# Patient Record
Sex: Male | Born: 2000 | Race: Black or African American | Hispanic: No | Marital: Single | Smoking: Current some day smoker
Health system: Southern US, Community
[De-identification: ages and names within clinical notes are randomized; demographics above are authoritative.]

## PROBLEM LIST (undated history)

## (undated) DIAGNOSIS — F909 Attention-deficit hyperactivity disorder, unspecified type: Secondary | ICD-10-CM

## (undated) HISTORY — PX: NO PAST SURGERIES: SHX2092

---
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
## 2019-03-22 ENCOUNTER — Ambulatory Visit (HOSPITAL_COMMUNITY)
Admission: EM | Admit: 2019-03-22 | Discharge: 2019-03-22 | Disposition: A | Discharge status: Home / Self Care | Payer: 59 | Attending: Internal Medicine | Admitting: Internal Medicine

## 2019-03-22 ENCOUNTER — Other Ambulatory Visit: Payer: Self-pay

## 2019-03-22 ENCOUNTER — Encounter (HOSPITAL_COMMUNITY): Payer: Self-pay

## 2019-03-22 DIAGNOSIS — Z20822 Contact with and (suspected) exposure to covid-19: Secondary | ICD-10-CM

## 2019-03-22 DIAGNOSIS — Z20828 Contact with and (suspected) exposure to other viral communicable diseases: Secondary | ICD-10-CM | POA: Insufficient documentation

## 2019-03-22 NOTE — ED Provider Notes (Signed)
[facility]    CSN: [id number] Arrival date & time: 03/22/19  1947      History   Chief Complaint Chief Complaint  Patient presents with  . covid test    HPI Garrett Leach is a 18 y.o. male with no past medical history comes to urgent care for COVID-19 testing.  His stepfather tested positive for COVID-19 today.  Patient has no symptoms.   HPI  History reviewed. No pertinent past medical history.  There are no problems to display for this patient.   History reviewed. No pertinent surgical history.     Home Medications    Prior to Admission medications   Not on File    Family History Family History  Problem Relation Age of Onset  . Healthy Mother   . Healthy Father     Social History Social History   Tobacco Use  . Smoking status: Never Smoker  . Smokeless tobacco: Never Used  Substance Use Topics  . Alcohol use: No  . Drug use: Not on file     Allergies   Patient has no known allergies.   Review of Systems Review of Systems  Constitutional: Negative for activity change, chills, fatigue and fever.  Respiratory: Negative for cough and shortness of breath.   Gastrointestinal: Negative for diarrhea, nausea and vomiting.  Neurological: Negative for dizziness, light-headedness and headaches.     Physical Exam Triage Vital Signs ED Triage Vitals  Enc Vitals Group     BP 03/22/19 2002 (!) 151/90     Pulse --      Resp 03/22/19 2002 18     Temp 03/22/19 2002 99.2 F (37.3 C)     Temp Source 03/22/19 2002 Oral     SpO2 03/22/19 2002 100 %     Weight 03/22/19 2001 (!) 304 lb (137.9 kg)     Height --      Head Circumference --      Peak Flow --      Pain Score 03/22/19 2001 0     Pain Loc --      Pain Edu? --      Excl. in GC? --    No data found.  Updated Vital Signs BP (!) 151/90 (BP Location: Right Arm)   Temp 99.2 F (37.3 C) (Oral)   Resp 18   Wt (!) 137.9 kg   SpO2 100%   Visual Acuity Right Eye Distance:     Left Eye Distance:   Bilateral Distance:    Right Eye Near:   Left Eye Near:    Bilateral Near:     Physical Exam Vitals and nursing note reviewed.  Constitutional:      General: He is not in acute distress.    Appearance: Normal appearance. He is not ill-appearing.  Cardiovascular:     Rate and Rhythm: Normal rate and regular rhythm.     Pulses: Normal pulses.     Heart sounds: Normal heart sounds.  Pulmonary:     Effort: Pulmonary effort is normal.     Breath sounds: Normal breath sounds.  Neurological:     Mental Status: He is alert.      UC Treatments / Results  Labs (all labs ordered are listed, but only abnormal results are displayed) Labs Reviewed  NOVEL CORONAVIRUS, NAA (HOSP ORDER, SEND-OUT TO REF LAB; TAT 18-24 HRS)    EKG   Radiology No results found.  Procedures Procedures (including critical care time)  Medications Ordered  in UC Medications - No data to display  Initial Impression / Assessment and Plan / UC Course  I have reviewed the triage vital signs and the nursing notes.  Pertinent labs & imaging results that were available during my care of the patient were reviewed by me and considered in my medical decision making (see chart for details).     1.  Close exposure to COVID-19 positive patient: COVID-19 PCR testing has been sent Patient is advised to self isolate until COVID-19 test results are available If patient develops any symptoms she can reach out to the urgent care via virtual visit to have his symptoms addressed or he can come in for in person evaluation. Final Clinical Impressions(s) / UC Diagnoses   Final diagnoses:  Close exposure to COVID-19 virus   Discharge Instructions   None    ED Prescriptions    None     PDMP not reviewed this encounter.   Chase Picket, MD 03/22/19 2027

## 2019-03-22 NOTE — ED Triage Notes (Addendum)
Pt state he needs to be tested for Covid . Because he has been exposed to it. Pt states his Step Dad has it.

## 2019-03-25 LAB — NOVEL CORONAVIRUS, NAA (HOSP ORDER, SEND-OUT TO REF LAB; TAT 18-24 HRS): SARS-CoV-2, NAA: NOT DETECTED

## 2019-09-25 ENCOUNTER — Encounter (HOSPITAL_COMMUNITY): Payer: Self-pay

## 2019-09-25 ENCOUNTER — Other Ambulatory Visit: Payer: Self-pay

## 2019-09-25 ENCOUNTER — Ambulatory Visit (HOSPITAL_COMMUNITY)
Admission: EM | Admit: 2019-09-25 | Discharge: 2019-09-25 | Disposition: A | Discharge status: Home / Self Care | Payer: BC Managed Care – PPO | Attending: Physician Assistant | Admitting: Physician Assistant

## 2019-09-25 DIAGNOSIS — L853 Xerosis cutis: Secondary | ICD-10-CM

## 2019-09-25 MED ORDER — TRIAMCINOLONE ACETONIDE 0.025 % EX OINT: 1.0000 "application " | TOPICAL_OINTMENT | Freq: Two times a day (BID) | CUTANEOUS | 0 refills | Status: AC

## 2019-09-25 NOTE — Discharge Instructions (Addendum)
Apply this cream to your heel twice a day, then apply a zinc oxide cream (OTC) on top of this twice a day as well. Sleep with this area covered while the cream is on at night. Follow up with Dermatology if this is not  improving. Keep area clean between times of application.

## 2019-09-25 NOTE — ED Provider Notes (Signed)
[facility]    CSN: [id number] Arrival date & time: 09/25/19  1518      History   Chief Complaint Chief Complaint  Patient presents with  . Callous Foot    HPI Garrett Leach is a 19 y.o. male.   Presents with cracked skin to the heel area of his left foot. Onset for a few months, but cracked over the last few days. No fever or drainage. Only pain with shoes. Not using any topical treatment. No prior skin history is noted.      History reviewed. No pertinent past medical history.  There are no problems to display for this patient.   History reviewed. No pertinent surgical history.     Home Medications    Prior to Admission medications   Medication Sig Start Date End Date Taking? Authorizing Provider  triamcinolone (KENALOG) 0.025 % ointment Apply 1 application topically 2 (two) times daily for 14 days. 09/25/19 10/09/19  Riki Sheer, PA-C    Family History Family History  Problem Relation Age of Onset  . Healthy Mother   . Healthy Father     Social History Social History   Tobacco Use  . Smoking status: Never Smoker  . Smokeless tobacco: Never Used  Substance Use Topics  . Alcohol use: No  . Drug use: Not on file     Allergies   Patient has no known allergies.   Review of Systems Review of Systems  Constitutional: Negative for fatigue and fever.     Physical Exam Triage Vital Signs ED Triage Vitals  Enc Vitals Group     BP 09/25/19 1550 124/60     Pulse Rate 09/25/19 1550 61     Resp 09/25/19 1550 18     Temp 09/25/19 1550 97.8 F (36.6 C)     Temp Source 09/25/19 1550 Oral     SpO2 09/25/19 1550 93 %     Weight --      Height --      Head Circumference --      Peak Flow --      Pain Score 09/25/19 1548 0     Pain Loc --      Pain Edu? --      Excl. in GC? --    No data found.  Updated Vital Signs BP 124/60 (BP Location: Right Arm)   Pulse 61   Temp 97.8 F (36.6 C) (Oral)   Resp 18   SpO2 93%   Visual  Acuity Right Eye Distance:   Left Eye Distance:   Bilateral Distance:    Right Eye Near:   Left Eye Near:    Bilateral Near:     Physical Exam Vitals and nursing note reviewed.  Constitutional:      Appearance: Normal appearance.  Skin:    General: Skin is warm and dry.     Comments: Skin thickening and fissure along left foot, approx 2 cm in length. No drainage.   Neurological:     General: No focal deficit present.     Mental Status: He is alert.  Psychiatric:        Mood and Affect: Mood normal.      UC Treatments / Results  Labs (all labs ordered are listed, but only abnormal results are displayed) Labs Reviewed - No data to display  EKG   Radiology No results found.  Procedures Procedures (including critical care time)  Medications Ordered in UC Medications - No data  to display  Initial Impression / Assessment and Plan / UC Course  I have reviewed the triage vital signs and the nursing notes.  Pertinent labs & imaging results that were available during my care of the patient were reviewed by me and considered in my medical decision making (see chart for details).     Treat with LP steroid cream with a layer of barrier cream twice a day. Keep covered and clean. If not improving f/u with Dermatology for further evaluation.  Final Clinical Impressions(s) / UC Diagnoses   Final diagnoses:  Xerosis of skin     Discharge Instructions     Apply this cream to your heel twice a day, then apply a zinc oxide cream (OTC) on top of this twice a day as well. Sleep with this area covered while the cream is on at night. Follow up with Dermatology if this is not  improving. Keep area clean between times of application.    ED Prescriptions    Medication Sig Dispense Auth. Provider   triamcinolone (KENALOG) 0.025 % ointment Apply 1 application topically 2 (two) times daily for 14 days. 30 g Bjorn Pippin, [place]     PDMP not reviewed this encounter.   Bjorn Pippin, [place] 09/25/19 1639

## 2019-09-25 NOTE — ED Triage Notes (Signed)
Pt presents with heavily callous area on back of left foot for a few months; pt states he has an area on right foot as well, it just is not as dry.

## 2019-11-25 ENCOUNTER — Ambulatory Visit (HOSPITAL_COMMUNITY)
Admission: EM | Admit: 2019-11-25 | Discharge: 2019-11-25 | Disposition: A | Discharge status: Home / Self Care | Payer: BC Managed Care – PPO | Attending: Internal Medicine | Admitting: Internal Medicine

## 2019-11-25 ENCOUNTER — Other Ambulatory Visit: Payer: Self-pay

## 2019-11-25 ENCOUNTER — Encounter (HOSPITAL_COMMUNITY): Payer: Self-pay

## 2019-11-25 DIAGNOSIS — U071 COVID-19: Secondary | ICD-10-CM | POA: Diagnosis not present

## 2019-11-25 DIAGNOSIS — Z20822 Contact with and (suspected) exposure to covid-19: Secondary | ICD-10-CM | POA: Diagnosis present
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
## 2019-11-26 LAB — SARS CORONAVIRUS 2 (TAT 6-24 HRS): SARS Coronavirus 2: POSITIVE — AB

## 2020-01-23 ENCOUNTER — Other Ambulatory Visit: Payer: Self-pay

## 2020-01-23 ENCOUNTER — Emergency Department (HOSPITAL_COMMUNITY): Payer: BC Managed Care – PPO

## 2020-01-23 ENCOUNTER — Encounter (HOSPITAL_COMMUNITY): Payer: Self-pay | Admitting: Emergency Medicine

## 2020-01-23 ENCOUNTER — Emergency Department (HOSPITAL_COMMUNITY)
Admission: EM | Admit: 2020-01-23 | Discharge: 2020-01-23 | Disposition: A | Discharge status: Home / Self Care | Payer: BC Managed Care – PPO | Attending: Emergency Medicine | Admitting: Emergency Medicine

## 2020-01-23 DIAGNOSIS — T07XXXA Unspecified multiple injuries, initial encounter: Secondary | ICD-10-CM

## 2020-01-23 DIAGNOSIS — S80212A Abrasion, left knee, initial encounter: Secondary | ICD-10-CM | POA: Diagnosis not present

## 2020-01-23 DIAGNOSIS — Y92410 Unspecified street and highway as the place of occurrence of the external cause: Secondary | ICD-10-CM | POA: Diagnosis not present

## 2020-01-23 DIAGNOSIS — Z23 Encounter for immunization: Secondary | ICD-10-CM | POA: Insufficient documentation

## 2020-01-23 DIAGNOSIS — S60922A Unspecified superficial injury of left hand, initial encounter: Secondary | ICD-10-CM | POA: Diagnosis present

## 2020-01-23 DIAGNOSIS — S66911A Strain of unspecified muscle, fascia and tendon at wrist and hand level, right hand, initial encounter: Secondary | ICD-10-CM

## 2020-01-23 DIAGNOSIS — S66912A Strain of unspecified muscle, fascia and tendon at wrist and hand level, left hand, initial encounter: Secondary | ICD-10-CM | POA: Diagnosis not present

## 2020-01-23 DIAGNOSIS — S80211A Abrasion, right knee, initial encounter: Secondary | ICD-10-CM | POA: Insufficient documentation

## 2020-01-23 MED ORDER — IBUPROFEN 600 MG PO TABS: 600.0000 mg | ORAL_TABLET | Freq: Four times a day (QID) | ORAL | 0 refills | Status: DC | PRN

## 2020-01-23 MED ORDER — TETANUS-DIPHTH-ACELL PERTUSSIS 5-2.5-18.5 LF-MCG/0.5 IM SUSP
0.5000 mL | Freq: Once | INTRAMUSCULAR | Status: AC
  Administered 2020-01-23: 0.5 mL via INTRAMUSCULAR
  Filled 2020-01-23: qty 0.5

## 2020-01-23 MED ORDER — IBUPROFEN 800 MG PO TABS
800.0000 mg | ORAL_TABLET | Freq: Once | ORAL | Status: AC
  Administered 2020-01-23: 800 mg via ORAL
  Filled 2020-01-23: qty 1

## 2020-01-23 MED ORDER — CYCLOBENZAPRINE HCL 10 MG PO TABS: 10.0000 mg | ORAL_TABLET | Freq: Two times a day (BID) | ORAL | 0 refills | Status: DC | PRN
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
## 2020-01-23 NOTE — ED Notes (Addendum)
Applied ace wrap to right wrist. Pt endorses comfort. Pt calling father for ride home. Pt alert and oriented X4 and states he will walk to lobby at discharge.  Pt verbalized understanding of discharge instructions and follow up care.
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
## 2020-01-23 NOTE — ED Notes (Signed)
Patient transported to x-ray. ?

## 2020-05-16 ENCOUNTER — Other Ambulatory Visit: Payer: Self-pay

## 2020-05-16 ENCOUNTER — Ambulatory Visit (HOSPITAL_COMMUNITY)
Admission: EM | Admit: 2020-05-16 | Discharge: 2020-05-16 | Disposition: A | Discharge status: Home / Self Care | Payer: BC Managed Care – PPO | Attending: Urgent Care | Admitting: Urgent Care

## 2020-05-16 ENCOUNTER — Encounter (HOSPITAL_COMMUNITY): Payer: Self-pay | Admitting: Emergency Medicine

## 2020-05-16 DIAGNOSIS — R229 Localized swelling, mass and lump, unspecified: Secondary | ICD-10-CM

## 2020-05-16 DIAGNOSIS — M542 Cervicalgia: Secondary | ICD-10-CM

## 2020-05-16 MED ORDER — TIZANIDINE HCL 4 MG PO TABS: 4.0000 mg | ORAL_TABLET | Freq: Three times a day (TID) | ORAL | 0 refills | Status: DC | PRN

## 2020-05-16 MED ORDER — NAPROXEN 500 MG PO TABS: 500.0000 mg | ORAL_TABLET | Freq: Two times a day (BID) | ORAL | 0 refills | Status: DC

## 2020-05-16 NOTE — ED Provider Notes (Signed)
[facility]   MRN: [id number] DOB: 12-10-2000  Subjective:   Garrett Leach is a 20 y.o. male presenting for 1 week history of acute onset persistent mild intermittent neck pain, slight stiffness.  Denies weakness, numbness or tingling, radicular symptoms, falls, trauma.  Patient does not do a lot of heavy lifting.  He does play basketball on occasion.  Cannot recall any particular injury while playing recently.  He is also worried about the swelling that he has over the right base side of his scalp.  Denies drainage of pus, bleeding, itching.  Denies any history of skin issues.  Does not have a PCP.  No current facility-administered medications for this encounter.  Current Outpatient Medications:  .  cyclobenzaprine (FLEXERIL) 10 MG tablet, Take 1 tablet (10 mg total) by mouth 2 (two) times daily as needed for muscle spasms., Disp: 20 tablet, Rfl: 0 .  ibuprofen (ADVIL) 600 MG tablet, Take 1 tablet (600 mg total) by mouth every 6 (six) hours as needed., Disp: 30 tablet, Rfl: 0 .  Multiple Vitamins-Minerals (MULTIVITAMIN WITH MINERALS) tablet, Take 1 tablet by mouth daily., Disp: , Rfl:    No Known Allergies  History reviewed. No pertinent past medical history.   History reviewed. No pertinent surgical history.  Family History  Problem Relation Age of Onset  . Healthy Mother   . Healthy Father     Social History   Tobacco Use  . Smoking status: Current Some Day Smoker  . Smokeless tobacco: Never Used  Substance Use Topics  . Alcohol use: No  . Drug use: Not Currently    ROS   Objective:   Vitals: BP (!) 138/59 (BP Location: Right Arm) Comment (BP Location): large cuff  Pulse 60   Temp 98.4 F (36.9 C) (Oral)   Resp (!) 22   SpO2 100%   Physical Exam Constitutional:      General: He is not in acute distress.    Appearance: Normal appearance. He is well-developed and normal weight. He is not ill-appearing, toxic-appearing or diaphoretic.   HENT:     Head: Normocephalic and atraumatic.      Right Ear: External ear normal.     Left Ear: External ear normal.     Nose: Nose normal.     Mouth/Throat:     Pharynx: Oropharynx is clear.  Eyes:     General: No scleral icterus.       Right eye: No discharge.        Left eye: No discharge.     Extraocular Movements: Extraocular movements intact.     Pupils: Pupils are equal, round, and reactive to light.  Cardiovascular:     Rate and Rhythm: Normal rate.  Pulmonary:     Effort: Pulmonary effort is normal.  Musculoskeletal:     Cervical back: Normal range of motion.     Comments: Full range of motion throughout including cervical region.  Strength 5/5 for upper extremities.  Patient ambulates without any assistance at expected pace.  No ecchymosis, swelling, lacerations or abrasions.  Patient does have mild paraspinal muscle tenderness along the cervical region excluding the midline.   Neurological:     Mental Status: He is alert and oriented to person, place, and time.     Motor: No weakness.     Coordination: Coordination normal.     Gait: Gait normal.     Deep Tendon Reflexes: Reflexes normal.  Psychiatric:  Mood and Affect: Mood normal.        Behavior: Behavior normal.        Thought Content: Thought content normal.        Judgment: Judgment normal.     Assessment and Plan :   PDMP not reviewed this encounter.  1. Skin mass   2. Neck pain     Will manage conservatively for musculoskeletal neck pain with NSAID and muscle relaxant, rest and modification of physical activity.  Anticipatory guidance provided.  Undifferentiated mass of the right side of the scalp overlying the occiput.  Recommended consultation with a dermatologist.  Counseled patient on potential for adverse effects with medications prescribed/recommended today, ER and return-to-clinic precautions discussed, patient verbalized understanding.    Wallis Bamberg, [place] 05/16/20 1642

## 2020-05-16 NOTE — Discharge Instructions (Signed)
[facility] dermatology Center will reach out to you in ~1-2 weeks if they will accept the referral for a consultation on your scalp. Otherwise, you can try Dr. Dorita Sciara office to see if they can see you about the scalp mass.

## 2020-05-16 NOTE — ED Triage Notes (Signed)
patient reports right side of neck is sore, patient feels a knot at base of skull on right side of neck.  Reports he noticed this couple of weeks ago.  Denies any illness.
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
## 2022-05-19 ENCOUNTER — Ambulatory Visit (HOSPITAL_COMMUNITY)
Admission: EM | Admit: 2022-05-19 | Discharge: 2022-05-19 | Disposition: A | Discharge status: Home / Self Care | Payer: Medicaid Other | Attending: Internal Medicine | Admitting: Internal Medicine

## 2022-05-19 ENCOUNTER — Encounter (HOSPITAL_COMMUNITY): Payer: Self-pay | Admitting: *Deleted

## 2022-05-19 ENCOUNTER — Other Ambulatory Visit: Payer: Self-pay

## 2022-05-19 DIAGNOSIS — J069 Acute upper respiratory infection, unspecified: Secondary | ICD-10-CM | POA: Diagnosis present

## 2022-05-19 DIAGNOSIS — Z1152 Encounter for screening for COVID-19: Secondary | ICD-10-CM | POA: Insufficient documentation

## 2022-05-19 HISTORY — DX: Attention-deficit hyperactivity disorder, unspecified type: F90.9

## 2022-05-19 MED ORDER — BENZONATATE 100 MG PO CAPS: 100.0000 mg | ORAL_CAPSULE | Freq: Three times a day (TID) | ORAL | 0 refills | Status: DC

## 2022-05-19 MED ORDER — PROMETHAZINE-DM 6.25-15 MG/5ML PO SYRP: 5.0000 mL | ORAL_SOLUTION | Freq: Every evening | ORAL | 0 refills | Status: DC | PRN

## 2022-05-19 NOTE — ED Provider Notes (Signed)
[facility]    CSN: [id number] Arrival date & time: 05/19/22  1631      History   Chief Complaint No chief complaint on file.   HPI Garrett Leach is a 22 y.o. male.   Patient presents to urgent care for evaluation of nasal congestion, cough, sore throat, headache, and generalized fatigue that started 3 days ago on Thursday, May 16, 2022.  No recent known sick contacts with similar symptoms.  Cough is dry and worse at nighttime.  Headache is generalized and he denies vision changes, dizziness, photophobia, and phonophobia.  Denies history of chronic respiratory problems.  He smokes cigars and uses marijuana, no other drug use. Denies chest pain, shortness of breath, extremity weakness, heart palpitations, nausea, vomiting, abdominal pain, urinary symptoms, fever/chills, and weakness.  No recent antibiotics or steroid use.  Has been using Alka-Seltzer plus one dose over-the-counter to help with symptoms without much relief.      Past Medical History:  Diagnosis Date   ADHD     There are no problems to display for this patient.   Past Surgical History:  Procedure Laterality Date   NO PAST SURGERIES         Home Medications    Prior to Admission medications   Medication Sig Start Date End Date Taking? Authorizing Provider  benzonatate (TESSALON) 100 MG capsule Take 1 capsule (100 mg total) by mouth every 8 (eight) hours. 05/19/22  Yes Talbot Grumbling, FNP  Lisdexamfetamine Dimesylate (VYVANSE PO) Take by mouth.   Yes [provider]  promethazine-dextromethorphan (PROMETHAZINE-DM) 6.25-15 MG/5ML syrup Take 5 mLs by mouth at bedtime as needed. 05/19/22  Yes Talbot Grumbling, FNP  cyclobenzaprine (FLEXERIL) 10 MG tablet Take 1 tablet (10 mg total) by mouth 2 (two) times daily as needed for muscle spasms. 01/23/20   Domenic Moras, PA-C  ibuprofen (ADVIL) 600 MG tablet Take 1 tablet (600 mg total) by mouth every 6 (six) hours as needed. 01/23/20    Domenic Moras, PA-C  Multiple Vitamins-Minerals (MULTIVITAMIN WITH MINERALS) tablet Take 1 tablet by mouth daily.    [provider]  naproxen (NAPROSYN) 500 MG tablet Take 1 tablet (500 mg total) by mouth 2 (two) times daily with a meal. 05/16/20   Jaynee Eagles, PA-C  tiZANidine (ZANAFLEX) 4 MG tablet Take 1 tablet (4 mg total) by mouth every 8 (eight) hours as needed. 05/16/20   Jaynee Eagles, PA-C    Family History Family History  Problem Relation Age of Onset   Healthy Mother    Healthy Father     Social History Social History   Tobacco Use   Smoking status: Every Day    Types: Cigars   Smokeless tobacco: Never  Vaping Use   Vaping Use: Former  Substance Use Topics   Alcohol use: No   Drug use: Yes    Types: Marijuana     Allergies   Patient has no known allergies.   Review of Systems Review of Systems Per HPI  Physical Exam Triage Vital Signs ED Triage Vitals  Enc Vitals Group     BP 05/19/22 1708 128/75     Pulse Rate 05/19/22 1708 (!) 53     Resp 05/19/22 1708 18     Temp 05/19/22 1708 98.1 F (36.7 C)     Temp Source 05/19/22 1708 Oral     SpO2 05/19/22 1708 97 %     Weight --      Height --  Head Circumference --      Peak Flow --      Pain Score 05/19/22 1709 4     Pain Loc --      Pain Edu? --      Excl. in [place]? --    No data found.  Updated Vital Signs BP 128/75   Pulse (!) 53   Temp 98.1 F (36.7 C) (Oral)   Resp 18   SpO2 97%   Visual Acuity Right Eye Distance:   Left Eye Distance:   Bilateral Distance:    Right Eye Near:   Left Eye Near:    Bilateral Near:     Physical Exam Vitals and nursing note reviewed.  Constitutional:      Appearance: He is not ill-appearing or toxic-appearing.  HENT:     Head: Normocephalic and atraumatic.     Right Ear: Hearing, tympanic membrane, ear canal and external ear normal.     Left Ear: Hearing, tympanic membrane, ear canal and external ear normal.     Nose: Congestion present.      Mouth/Throat:     Lips: Pink.     Mouth: Mucous membranes are moist. No injury.     Tongue: No lesions. Tongue does not deviate from midline.     Palate: No mass and lesions.     Pharynx: Oropharynx is clear. Uvula midline. Posterior oropharyngeal erythema present. No pharyngeal swelling, oropharyngeal exudate or uvula swelling.     Tonsils: No tonsillar exudate or tonsillar abscesses.  Eyes:     General: Lids are normal. Vision grossly intact. Gaze aligned appropriately.        Right eye: No discharge.        Left eye: No discharge.     Extraocular Movements: Extraocular movements intact.     Conjunctiva/sclera: Conjunctivae normal.  Cardiovascular:     Rate and Rhythm: Normal rate and regular rhythm.     Heart sounds: Normal heart sounds, S1 normal and S2 normal.  Pulmonary:     Effort: Pulmonary effort is normal. No respiratory distress.     Breath sounds: Normal breath sounds and air entry.  Abdominal:     General: Abdomen is flat. Bowel sounds are normal.     Palpations: Abdomen is soft.     Tenderness: There is no abdominal tenderness.  Musculoskeletal:     Cervical back: Neck supple.  Lymphadenopathy:     Cervical: No cervical adenopathy.  Skin:    General: Skin is warm and dry.     Capillary Refill: Capillary refill takes less than 2 seconds.     Findings: No rash.  Neurological:     General: No focal deficit present.     Mental Status: He is alert and oriented to person, place, and time. Mental status is at baseline.     Cranial Nerves: No dysarthria or facial asymmetry.  Psychiatric:        Mood and Affect: Mood normal.        Speech: Speech normal.        Behavior: Behavior normal.        Thought Content: Thought content normal.        Judgment: Judgment normal.      UC Treatments / Results  Labs (all labs ordered are listed, but only abnormal results are displayed) Labs Reviewed  SARS CORONAVIRUS 2 (TAT 6-24 HRS)    EKG   Radiology No results  found.  Procedures Procedures (including critical care time)  Medications Ordered in UC Medications - No data to display  Initial Impression / Assessment and Plan / UC Course  I have reviewed the triage vital signs and the nursing notes.  Pertinent labs & imaging results that were available during my care of the patient were reviewed by me and considered in my medical decision making (see chart for details).   1. Viral URI with cough Symptoms and physical exam consistent with a viral upper respiratory tract infection that will likely resolve with rest, fluids, and prescriptions for symptomatic relief. Deferred imaging based on stable cardiopulmonary exam and hemodynamically stable vital signs. COVID-19 testing is pending.  We will call patient if this is positive.  Quarantine guidelines discussed. Currently on day 4 of symptoms and does qualify for antiviral therapy.   Tessalon perles and promethazine DM sent to pharmacy for symptomatic relief to be taken as prescribed.  May continue taking over the counter medications as directed for further symptomatic relief.  Drowsiness precautions discussed regarding promethazine DM prescription.  Nonpharmacologic interventions for symptom relief provided and after visit summary below. Advised to push fluids to stay well hydrated while recovering from viral illness.   Discussed physical exam and available lab work findings in clinic with patient.  Counseled patient regarding appropriate use of medications and potential side effects for all medications recommended or prescribed today. Discussed red flag signs and symptoms of worsening condition,when to call the PCP office, return to urgent care, and when to seek higher level of care in the emergency department. Patient verbalizes understanding and agreement with plan. All questions answered. Patient discharged in stable condition.     Final Clinical Impressions(s) / UC Diagnoses   Final diagnoses:  Viral  URI with cough     Discharge Instructions      You have a viral upper respiratory infection.  COVID-19 testing is pending. We will call you with results if positive. If your COVID test is positive, you must stay at home until day 6 of symptoms. On day 6, you may go out into public and go back to work, but you must wear a mask until day 11 of symptoms to prevent spread to others.  Use the following medicines to help with symptoms: - Plain Mucinex (guaifenesin) over the counter as directed every 12 hours to thin mucous so that you are able to get it out of your body easier. Drink plenty of water while taking this medication so that it works well in your body (at least 8 cups a day).  - Tylenol 1,019m and/or ibuprofen 6073mevery 6 hours with food as needed for aches/pains or fever/chills.  - Tessalon perles every 8 hours as needed for cough. - Take Promethazine DM cough medication to help with your cough at nighttime so that you are able to sleep. Do not drive, drink alcohol, or go to work while taking this medication since it can make you sleepy. Only take this at nighttime.   1 tablespoon of honey in warm water and/or salt water gargles may also help with symptoms. Humidifier to your room will help add water to the air and reduce coughing.  If you develop any new or worsening symptoms, please return.  If your symptoms are severe, please go to the emergency room.  Follow-up with your primary care provider for further evaluation and management of your symptoms as well as ongoing wellness visits.  I hope you feel better!   ED Prescriptions     Medication Sig Dispense Auth.  Provider   benzonatate (TESSALON) 100 MG capsule Take 1 capsule (100 mg total) by mouth every 8 (eight) hours. 21 capsule Talbot Grumbling, FNP   promethazine-dextromethorphan (PROMETHAZINE-DM) 6.25-15 MG/5ML syrup Take 5 mLs by mouth at bedtime as needed. 118 mL Talbot Grumbling, FNP      PDMP not reviewed  this encounter.   Talbot Grumbling, [place] 05/19/22 2029

## 2022-05-19 NOTE — Discharge Instructions (Signed)
You have a viral upper respiratory infection.  COVID-19 testing is pending. We will call you with results if positive. If your COVID test is positive, you must stay at home until day 6 of symptoms. On day 6, you may go out into public and go back to work, but you must wear a mask until day 11 of symptoms to prevent spread to others.  Use the following medicines to help with symptoms: - Plain Mucinex (guaifenesin) over the counter as directed every 12 hours to thin mucous so that you are able to get it out of your body easier. Drink plenty of water while taking this medication so that it works well in your body (at least 8 cups a day).  - Tylenol 1,02m and/or ibuprofen [id number] 6 hours with food as needed for aches/pains or fever/chills.  - Tessalon perles every 8 hours as needed for cough. - Take Promethazine DM cough medication to help with your cough at nighttime so that you are able to sleep. Do not drive, drink alcohol, or go to work while taking this medication since it can make you sleepy. Only take this at nighttime.   1 tablespoon of honey in warm water and/or salt water gargles may also help with symptoms. Humidifier to your room will help add water to the air and reduce coughing.  If you develop any new or worsening symptoms, please return.  If your symptoms are severe, please go to the emergency room.  Follow-up with your primary care provider for further evaluation and management of your symptoms as well as ongoing wellness visits.  I hope you feel better!

## 2022-05-19 NOTE — ED Triage Notes (Signed)
C/O sore throat and nasal congestion onset 4 days ago. Now also has HA, slight cough, and right eye pain -- denies any vision changes, eye irritation, redness or drainage. No known fevers. Has taken Alka Seltzer once.

## 2022-05-20 LAB — SARS CORONAVIRUS 2 (TAT 6-24 HRS): SARS Coronavirus 2: NEGATIVE

## 2022-07-10 ENCOUNTER — Other Ambulatory Visit: Payer: Self-pay

## 2022-07-10 ENCOUNTER — Emergency Department (HOSPITAL_COMMUNITY)
Admission: EM | Admit: 2022-07-10 | Discharge: 2022-07-10 | Disposition: A | Discharge status: Home / Self Care | Payer: Medicaid Other | Attending: Emergency Medicine | Admitting: Emergency Medicine

## 2022-07-10 ENCOUNTER — Encounter (HOSPITAL_COMMUNITY): Payer: Self-pay

## 2022-07-10 DIAGNOSIS — W503XXA Accidental bite by another person, initial encounter: Secondary | ICD-10-CM | POA: Insufficient documentation

## 2022-07-10 DIAGNOSIS — S51851A Open bite of right forearm, initial encounter: Secondary | ICD-10-CM | POA: Insufficient documentation

## 2022-07-10 MED ORDER — AMOXICILLIN-POT CLAVULANATE 875-125 MG PO TABS
1.0000 | ORAL_TABLET | Freq: Once | ORAL | Status: AC
  Administered 2022-07-10: 1 via ORAL
  Filled 2022-07-10: qty 1

## 2022-07-10 MED ORDER — AMOXICILLIN-POT CLAVULANATE 875-125 MG PO TABS: 1.0000 | ORAL_TABLET | Freq: Two times a day (BID) | ORAL | 0 refills | Status: DC
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
## 2022-09-16 ENCOUNTER — Ambulatory Visit
Admission: EM | Admit: 2022-09-16 | Discharge: 2022-09-16 | Disposition: A | Discharge status: Home / Self Care | Payer: Medicaid Other | Attending: Nurse Practitioner | Admitting: Nurse Practitioner

## 2022-09-16 ENCOUNTER — Encounter: Payer: Self-pay | Admitting: *Deleted

## 2022-09-16 DIAGNOSIS — T7849XA Other allergy, initial encounter: Secondary | ICD-10-CM | POA: Diagnosis not present

## 2022-09-16 DIAGNOSIS — S41111A Laceration without foreign body of right upper arm, initial encounter: Secondary | ICD-10-CM

## 2022-09-16 NOTE — Discharge Instructions (Addendum)
Your wound appears ok. No signs of infection at this time. The blisters are likely due to the bandaids you have been using.   Keep the wound clean and dry. Clean the wound one time each day with soap and water. Pat the wound dry with a clean towel. Do not rub the wound. Apply a thin layer of antibiotic ointment. Cover the wound with a nonstick dressing and coban. This will prevent infection and keep the dressing from sticking to the wound. It will also keep from adhesive sticking to your skin causing blisters.  Check your wound every day for signs of infection. Watch for: More redness, swelling, or pain. Fluid or blood. Warmth. Pus or a bad smell.

## 2022-09-16 NOTE — ED Triage Notes (Signed)
Pt reports cutting right forearm on a blade while at work 5 days ago. States has been washing with witch hazel and applying Neosporin and a Bandaid. States noticed small blisters next to wound yesterday and feels the blisters are increasing. Laceration without S/S infection.

## 2022-09-16 NOTE — ED Provider Notes (Signed)
[facility]    CSN: [id number] Arrival date & time: 09/16/22  8295      History   Chief Complaint Chief Complaint  Patient presents with   Wound Check   Blister    HPI Garrett Leach is a 22 y.o. male.   Subjective:   Garrett Leach is a 22 y.o. male who presents today for wound check. Patient has a laceration wound which is located on the right forearm. Patient reports that he accidentally cut his arm with a blade while at work about 5 days ago.  Wound is healing well.  He has been keeping it clean and applying antibiotic ointment to it.  However, he noted some blisters around the wound and is concerned about that.  He denies any pain, erythema, drainage or fever.  Last tetanus was in 2021.      Past Medical History:  Diagnosis Date   ADHD     There are no problems to display for this patient.   Past Surgical History:  Procedure Laterality Date   NO PAST SURGERIES         Home Medications    Prior to Admission medications   Medication Sig Start Date End Date Taking? Authorizing Provider  Lisdexamfetamine Dimesylate (VYVANSE PO) Take by mouth.   Yes [provider]  Multiple Vitamins-Minerals (MULTIVITAMIN WITH MINERALS) tablet Take 1 tablet by mouth daily.    [provider]    Family History Family History  Problem Relation Age of Onset   Healthy Mother    Healthy Father     Social History Social History   Tobacco Use   Smoking status: Some Days    Types: Cigars   Smokeless tobacco: Never  Vaping Use   Vaping Use: Former  Substance Use Topics   Alcohol use: Yes    Comment: occasionally   Drug use: Not Currently    Types: Marijuana     Allergies   Patient has no known allergies.   Review of Systems Review of Systems  Constitutional:  Negative for fever.  Gastrointestinal:  Negative for nausea and vomiting.  Skin:  Positive for wound.  All other systems reviewed and are negative.    Physical  Exam Triage Vital Signs ED Triage Vitals [09/16/22 1103]  Enc Vitals Group     BP 111/74     Pulse Rate (!) 57     Resp 16     Temp 98 F (36.7 C)     Temp Source Oral     SpO2 99 %     Weight      Height      Head Circumference      Peak Flow      Pain Score 0     Pain Loc      Pain Edu?      Excl. in GC?    No data found.  Updated Vital Signs BP 111/74   Pulse (!) 57   Temp 98 F (36.7 C) (Oral)   Resp 16   SpO2 99%   Visual Acuity Right Eye Distance:   Left Eye Distance:   Bilateral Distance:    Right Eye Near:   Left Eye Near:    Bilateral Near:     Physical Exam Constitutional:      General: He is not in acute distress.    Appearance: Normal appearance. He is obese. He is not ill-appearing, toxic-appearing or diaphoretic.  HENT:  Head: Normocephalic.  Cardiovascular:     Rate and Rhythm: Normal rate.  Pulmonary:     Effort: Pulmonary effort is normal.  Musculoskeletal:        General: Normal range of motion.     Cervical back: Normal range of motion and neck supple.  Skin:    General: Skin is warm and dry.     Findings: Wound present.     Comments: Wound to right forearm. See picture below   Neurological:     General: No focal deficit present.     Mental Status: He is alert and oriented to person, place, and time.      UC Treatments / Results  Labs (all labs ordered are listed, but only abnormal results are displayed) Labs Reviewed - No data to display  EKG   Radiology No results found.  Procedures Procedures (including critical care time)  Medications Ordered in UC Medications - No data to display  Initial Impression / Assessment and Plan / UC Course  I have reviewed the triage vital signs and the nursing notes.  Pertinent labs & imaging results that were available during my care of the patient were reviewed by me and considered in my medical decision making (see chart for details).    22 yo male senting for wound check.   He has a laceration to the right forearm that appears to be healing well.  Indications of acute infection.  Blisters is likely due to the Band-Aids that he has been using.  Patient is afebrile and nontoxic.  Physical exam as above.  Wound care and indications for follow-up discussed.  Today's evaluation has revealed no signs of a dangerous process. Discussed diagnosis with patient and/or guardian. Patient and/or guardian aware of their diagnosis, possible red flag symptoms to watch out for and need for close follow up. Patient and/or guardian understands verbal and written discharge instructions. Patient and/or guardian comfortable with plan and disposition.  Patient and/or guardian has a clear mental status at this time, good insight into illness (after discussion and teaching) and has clear judgment to make decisions regarding their care  Documentation was completed with the aid of voice recognition software. Transcription may contain typographical errors.  Final Clinical Impressions(s) / UC Diagnoses   Final diagnoses:  Laceration of right upper extremity, initial encounter  Allergic reaction to adhesive     Discharge Instructions      Your wound appears ok. No signs of infection at this time. The blisters are likely due to the bandaids you have been using.   Keep the wound clean and dry. Clean the wound one time each day with soap and water. Pat the wound dry with a clean towel. Do not rub the wound. Apply a thin layer of antibiotic ointment. Cover the wound with a nonstick dressing and coban. This will prevent infection and keep the dressing from sticking to the wound. It will also keep from adhesive sticking to your skin causing blisters.  Check your wound every day for signs of infection. Watch for: More redness, swelling, or pain. Fluid or blood. Warmth. Pus or a bad smell.     ED Prescriptions   None    PDMP not reviewed this encounter.   Lurline Idol, [place] 09/16/22  1134

## 2022-09-16 NOTE — ED Notes (Signed)
Per providers instruction pt's wound was cleansed with wound cleaner then dressed with non-adherent gauze and perforated tape.

## 2023-02-26 ENCOUNTER — Emergency Department (HOSPITAL_COMMUNITY)
Admission: EM | Admit: 2023-02-26 | Discharge: 2023-02-26 | Disposition: A | Discharge status: Home / Self Care | Payer: Medicaid Other | Attending: Emergency Medicine | Admitting: Emergency Medicine

## 2023-02-26 ENCOUNTER — Emergency Department (HOSPITAL_COMMUNITY): Payer: Medicaid Other

## 2023-02-26 DIAGNOSIS — M545 Low back pain, unspecified: Secondary | ICD-10-CM

## 2023-02-26 DIAGNOSIS — X509XXA Other and unspecified overexertion or strenuous movements or postures, initial encounter: Secondary | ICD-10-CM | POA: Insufficient documentation

## 2023-02-26 DIAGNOSIS — S39012A Strain of muscle, fascia and tendon of lower back, initial encounter: Secondary | ICD-10-CM

## 2023-02-26 DIAGNOSIS — Y9367 Activity, basketball: Secondary | ICD-10-CM | POA: Insufficient documentation

## 2023-02-26 MED ORDER — LIDOCAINE 5 % EX PTCH
1.0000 | MEDICATED_PATCH | Freq: Once | CUTANEOUS | Status: DC
  Administered 2023-02-26: 1 via TRANSDERMAL
  Filled 2023-02-26: qty 1

## 2023-02-26 MED ORDER — DEXAMETHASONE SODIUM PHOSPHATE 10 MG/ML IJ SOLN
10.0000 mg | Freq: Once | INTRAMUSCULAR | Status: AC
  Administered 2023-02-26: 10 mg via INTRAMUSCULAR
  Filled 2023-02-26: qty 1

## 2023-02-26 MED ORDER — KETOROLAC TROMETHAMINE 30 MG/ML IJ SOLN
30.0000 mg | Freq: Once | INTRAMUSCULAR | Status: AC
  Administered 2023-02-26: 30 mg via INTRAMUSCULAR
  Filled 2023-02-26: qty 1

## 2023-02-26 MED ORDER — METHOCARBAMOL 500 MG PO TABS: 500.0000 mg | ORAL_TABLET | Freq: Two times a day (BID) | ORAL | 0 refills | Status: AC

## 2023-02-26 NOTE — ED Provider Notes (Signed)
[place] EMERGENCY DEPARTMENT AT [facility] Provider Note   CSN: [id number] Arrival date & time: 02/26/23  1904     History  Chief Complaint  Patient presents with   Back Pain    Garrett Leach is a 22 y.o. male who presents emergency department complaining of lower back pain.  Patient states that he was playing basketball yesterday and after going over them and coming down, he felt like he pulled something in his lower back.  He tried taking extra strength Tylenol with no relief.  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  Had some soreness in his thighs, but otherwise pain does not radiate down the legs.  No numbness or tingling.  Difficulty making it to the bathroom due to pain, but no retention or frank incontinence.  No bowel changes.   Back Pain Associated symptoms: no numbness and no weakness        Home Medications Prior to Admission medications   Medication Sig Start Date End Date Taking? Authorizing Provider  methocarbamol (ROBAXIN) 500 MG tablet Take 1 tablet (500 mg total) by mouth 2 (two) times daily. 02/26/23  Yes Jaliyah Fotheringham T, PA-C  Lisdexamfetamine Dimesylate (VYVANSE PO) Take by mouth.    [provider]  Multiple Vitamins-Minerals (MULTIVITAMIN WITH MINERALS) tablet Take 1 tablet by mouth daily.    [provider]      Allergies    Patient has no known allergies.    Review of Systems   Review of Systems  Genitourinary:  Negative for decreased urine volume and difficulty urinating.  Musculoskeletal:  Positive for back pain.  Neurological:  Negative for weakness and numbness.  All other systems reviewed and are negative.   Physical Exam Updated Vital Signs BP (!) 133/104 (BP Location: Right Arm)   Pulse 82   Temp 98.9 F (37.2 C) (Oral)   Resp 18   SpO2 100%  Physical Exam Vitals and nursing note reviewed.   Constitutional:      Appearance: Normal appearance.  HENT:     Head: Normocephalic and atraumatic.  Eyes:     Conjunctiva/sclera: Conjunctivae normal.  Pulmonary:     Effort: Pulmonary effort is normal. No respiratory distress.  Musculoskeletal:     Comments: Some midline lumbar tenderness, but mainly paraspinal muscular tenderness bilaterally, no step offs or crepitus. 5/5 strength of bilateral legs. Normal sensation in the legs and no saddle paresthesias.   Skin:    General: Skin is warm and dry.  Neurological:     Mental Status: He is alert.  Psychiatric:        Mood and Affect: Mood normal.        Behavior: Behavior normal.     ED Results / Procedures / Treatments   Labs (all labs ordered are listed, but only abnormal results are displayed) Labs Reviewed - No data to display  EKG None  Radiology DG Lumbar Spine Complete  Result Date: 02/26/2023 CLINICAL DATA:  Low back pain EXAM: LUMBAR SPINE - COMPLETE 4+ VIEW COMPARISON:  None Available. FINDINGS: There is no evidence of lumbar spine fracture. Alignment is normal. Intervertebral disc spaces are maintained. IMPRESSION: Negative. Electronically Signed   By: Jasmine Pang M.D.   On: 02/26/2023 20:37    Procedures Procedures    Medications Ordered in ED Medications  ketorolac (TORADOL) 30 MG/ML injection 30 mg (has  no administration in time range)  dexamethasone (DECADRON) injection 10 mg (has no administration in time range)  lidocaine (LIDODERM) 5 % 1 patch (has no administration in time range)    ED Course/ Medical Decision Making/ A&P                                 Medical Decision Making Amount and/or Complexity of Data Reviewed Radiology: ordered.  Risk Prescription drug management.   This patient is a 22 y.o. male who presents to the ED for concern of low back pain after injury playing basketball.   Differential diagnoses prior to evaluation: Fracture (acute/chronic), muscle strain, cauda equina  / myelopathy, spinal stenosis, DDD, ligamentous injury, disk herniation, radiculopathy  Past Medical History / Social History / Additional history: Chart reviewed. Pertinent results include: ADHD  Physical Exam: Physical exam performed. The pertinent findings include: Some midline lumbar tenderness, but mainly paraspinal muscular tenderness bilaterally, no step offs or crepitus. Neurovascularly intact in all extremities.  Imaging: XR of lumbar spine without acute findings.   Medications / Treatment: Given toradol and decadron   Disposition: After consideration of the diagnostic results and the patients response to treatment, I feel that emergency department workup does not suggest an emergent condition requiring admission or immediate intervention beyond what has been performed at this time. Patient with back pain.  No neurological deficits and normal neuro exam.  Patient can walk but states is painful.  No loss of bowel or bladder control.  No concern for cauda equina.  No fever, night sweats, weight loss, h/o cancer, IVDU.  The plan is: discharge to home with treatment of likely lumbar strain. Recommend NSAIDs, stretching, and given prescription for muscle relaxer.  The patient is safe for discharge and has been instructed to return immediately for worsening symptoms, change in symptoms or any other concerns.  Final Clinical Impression(s) / ED Diagnoses Final diagnoses:  Acute midline low back pain without sciatica  Strain of lumbar region, initial encounter    Rx / DC Orders ED Discharge Orders          Ordered    methocarbamol (ROBAXIN) 500 MG tablet  2 times daily        02/26/23 2112           Portions of this report may have been transcribed using voice recognition software. Every effort was made to ensure accuracy; however, inadvertent computerized transcription errors may be present.    Jeanella Flattery 02/26/23 2115    Maia Plan, MD 03/02/23 [phone]

## 2023-02-26 NOTE — ED Triage Notes (Signed)
Patient in today reporting ongoing back pain since yesterday after coming down wrong and injuring lower back. Difficulty ambulating.

## 2023-02-26 NOTE — Discharge Instructions (Addendum)
You were seen in the emergency department today for back pain.   I suspect this is likely caused by a muscle injury.  Rest, gentle exercise and stretching will aid in recovery from any injuries to your back.  Using medication such as Tylenol and ibuprofen will help alleviate pain as well as decrease swelling and inflammation associated with these injuries. You may use 600 mg ibuprofen every 6 hours or 1000 mg of Tylenol every 6 hours.  You may choose to alternate between the 2.  This would be most effective.  Not to exceed 4 g of Tylenol within 24 hours.  Not to exceed 3200 mg ibuprofen 24 hours.  I have prescribed you: muscle relaxers. I attempted to write a prescription for lidocaine patches but since it is an over the counter medication, it required a prior authorization I was unable to complete. You can pick up a box of these from the pharmacy instead.   Salt water/Epson salt soaks, massage, icy hot/Biofreeze/BenGay and other similar products can help with symptoms. The muscle relaxers can make you sleepy so do not take other sedating medications or drink alcohol while on this medicine. Do not take before driving or operating other machinery.   Please return to the emergency department for reevaluation if you denies any new or concerning symptoms such as fever, new numbness or weakness in your legs, difficulty using the restroom or incontinence.

## 2023-06-13 ENCOUNTER — Ambulatory Visit (HOSPITAL_COMMUNITY)
